# Patient Record
Sex: Male | Born: 1985 | Race: White | Hispanic: No | Marital: Single | Smoking: Never smoker
Health system: Southern US, Community
[De-identification: ages and names within clinical notes are randomized; demographics above are authoritative.]

---

## 2022-03-03 ENCOUNTER — Emergency Department (HOSPITAL_COMMUNITY): Payer: 59

## 2022-03-03 ENCOUNTER — Emergency Department (HOSPITAL_COMMUNITY)
Admission: EM | Admit: 2022-03-03 | Discharge: 2022-03-03 | Disposition: A | Discharge status: Home / Self Care | Payer: 59 | Attending: Emergency Medicine | Admitting: Emergency Medicine

## 2022-03-03 ENCOUNTER — Other Ambulatory Visit: Payer: Self-pay

## 2022-03-03 ENCOUNTER — Encounter (HOSPITAL_COMMUNITY): Payer: Self-pay | Admitting: Emergency Medicine

## 2022-03-03 DIAGNOSIS — H538 Other visual disturbances: Secondary | ICD-10-CM | POA: Diagnosis not present

## 2022-03-03 DIAGNOSIS — R7989 Other specified abnormal findings of blood chemistry: Secondary | ICD-10-CM | POA: Diagnosis not present

## 2022-03-03 DIAGNOSIS — R5383 Other fatigue: Secondary | ICD-10-CM | POA: Diagnosis not present

## 2022-03-03 DIAGNOSIS — R55 Syncope and collapse: Secondary | ICD-10-CM | POA: Insufficient documentation

## 2022-03-03 DIAGNOSIS — R519 Headache, unspecified: Secondary | ICD-10-CM

## 2022-03-03 LAB — CSF CELL COUNT WITH DIFFERENTIAL
RBC Count, CSF: 1 /mm3 — ABNORMAL HIGH
RBC Count, CSF: 17 /mm3 — ABNORMAL HIGH
Tube #: 1
Tube #: 4
WBC, CSF: 1 /mm3 (ref 0–5)
WBC, CSF: 1 /mm3 (ref 0–5)

## 2022-03-03 LAB — CBG MONITORING, ED: Glucose-Capillary: 79 mg/dL (ref 70–99)

## 2022-03-03 LAB — URINALYSIS, COMPLETE (UACMP) WITH MICROSCOPIC
Bacteria, UA: NONE SEEN
Bilirubin Urine: NEGATIVE
Glucose, UA: NEGATIVE mg/dL
Hgb urine dipstick: NEGATIVE
Ketones, ur: NEGATIVE mg/dL
Leukocytes,Ua: NEGATIVE
Nitrite: NEGATIVE
Protein, ur: NEGATIVE mg/dL
Specific Gravity, Urine: 1.004 — ABNORMAL LOW (ref 1.005–1.030)
pH: 6 (ref 5.0–8.0)

## 2022-03-03 LAB — CBC WITH DIFFERENTIAL/PLATELET
Abs Immature Granulocytes: 0.01 10*3/uL (ref 0.00–0.07)
Basophils Absolute: 0 10*3/uL (ref 0.0–0.1)
Basophils Relative: 0 %
Eosinophils Absolute: 0.1 10*3/uL (ref 0.0–0.5)
Eosinophils Relative: 3 %
HCT: 40.3 % (ref 39.0–52.0)
Hemoglobin: 13.9 g/dL (ref 13.0–17.0)
Immature Granulocytes: 0 %
Lymphocytes Relative: 26 %
Lymphs Abs: 1.2 10*3/uL (ref 0.7–4.0)
MCH: 30.5 pg (ref 26.0–34.0)
MCHC: 34.5 g/dL (ref 30.0–36.0)
MCV: 88.4 fL (ref 80.0–100.0)
Monocytes Absolute: 0.4 10*3/uL (ref 0.1–1.0)
Monocytes Relative: 9 %
Neutro Abs: 2.8 10*3/uL (ref 1.7–7.7)
Neutrophils Relative %: 62 %
Platelets: 301 10*3/uL (ref 150–400)
RBC: 4.56 MIL/uL (ref 4.22–5.81)
RDW: 12.6 % (ref 11.5–15.5)
WBC: 4.6 10*3/uL (ref 4.0–10.5)
nRBC: 0 % (ref 0.0–0.2)

## 2022-03-03 LAB — COMPREHENSIVE METABOLIC PANEL
ALT: 17 U/L (ref 0–44)
AST: 26 U/L (ref 15–41)
Albumin: 4.3 g/dL (ref 3.5–5.0)
Alkaline Phosphatase: 63 U/L (ref 38–126)
Anion gap: 5 (ref 5–15)
BUN: 12 mg/dL (ref 6–20)
CO2: 30 mmol/L (ref 22–32)
Calcium: 9.3 mg/dL (ref 8.9–10.3)
Chloride: 102 mmol/L (ref 98–111)
Creatinine, Ser: 0.64 mg/dL (ref 0.61–1.24)
GFR, Estimated: >60 mL/min (ref >60)
Glucose, Bld: 93 mg/dL (ref 70–99)
Potassium: 4.8 mmol/L (ref 3.5–5.1)
Sodium: 137 mmol/L (ref 135–145)
Total Bilirubin: 1.5 mg/dL — ABNORMAL HIGH (ref 0.3–1.2)
Total Protein: 7.8 g/dL (ref 6.5–8.1)

## 2022-03-03 LAB — TROPONIN I (HIGH SENSITIVITY): Troponin I (High Sensitivity): 4 ng/L (ref <18)

## 2022-03-03 LAB — PROTEIN, CSF: Total  Protein, CSF: 47 mg/dL — ABNORMAL HIGH (ref 15–45)

## 2022-03-03 LAB — GLUCOSE, CSF: Glucose, CSF: 58 mg/dL (ref 40–70)

## 2022-03-03 IMAGING — MR MR HEAD WO/W CM
13 series · 48 of 48 positions shown · IV contrast (gadavist)
Comparison: None.

CLINICAL DATA: Headache, new or worsening, neuro deficit (Age
18-49y) right unilateral headache, left blurry vision

EXAM:
MRI HEAD WITHOUT AND WITH CONTRAST
TECHNIQUE: Multiplanar, multiecho pulse sequences of the brain and surrounding
structures were obtained without and with intravenous contrast.
CONTRAST:  6mL GADAVIST GADOBUTROL 1 MMOL/ML IV SOLN

[Series 5: DWI · axial · 3.0mm · 1.36mm/px · z∈[-52,+97]mm · 6 of 104 slices shown (1 of 2)]
[im 1/104]
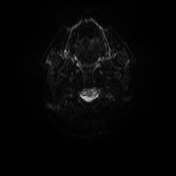
[im 21/104]
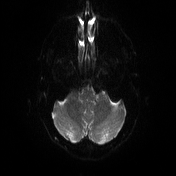
[im 42/104]
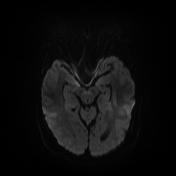
[im 62/104]
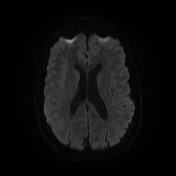
[im 83/104]
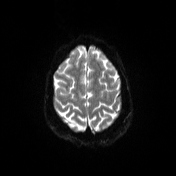
[im 104/104]
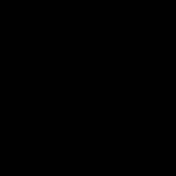

[Series 6: DWI · axial · 3.0mm · 1.36mm/px · z∈[-52,+91]mm · 3 of 50 slices shown (2 of 2)]
[im 1/50]
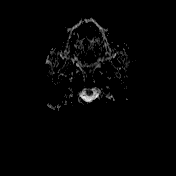
[im 25/50]
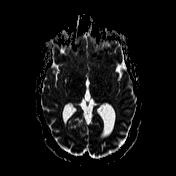
[im 50/50]
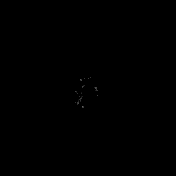

[Series 7: T1 · sagittal · 5.0mm · 0.75mm/px · 1 of 24 slices shown (1 of 2)]
[im 1/24]
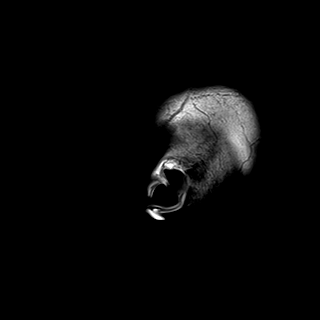

[Series 8: T2 · axial · 5.0mm · 0.62mm/px · z∈[-58,+100]mm · 2 of 26 slices shown]
[im 1/26]
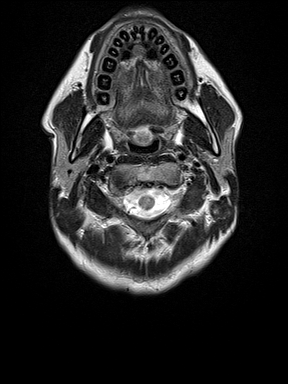
[im 26/26]
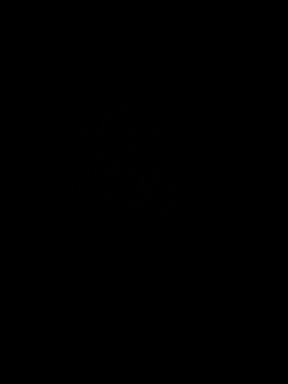

[Series 9: swi_images · axial · 3.0mm · 0.75mm/px · z∈[-59,+101]mm · 3 of 56 slices shown]
[im 1/56]
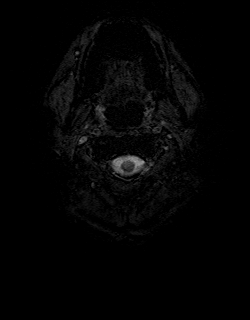
[im 28/56]
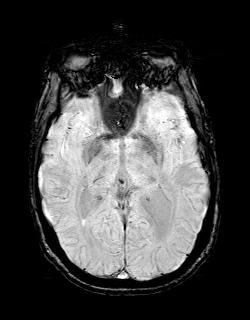
[im 56/56]
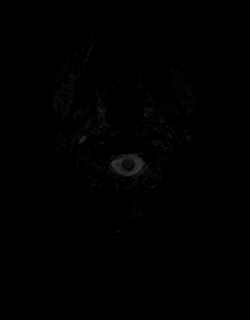

[Series 11: FLAIR · axial · 3.0mm · 0.75mm/px · z∈[-57,+98]mm · 3 of 54 slices shown]
[im 1/54]
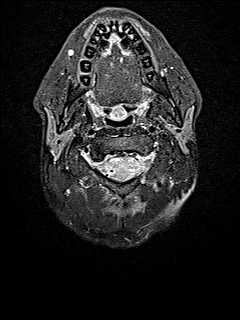
[im 27/54]
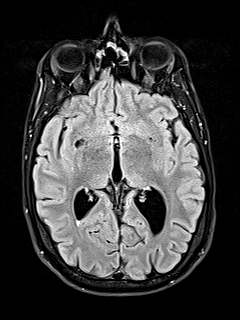
[im 54/54]
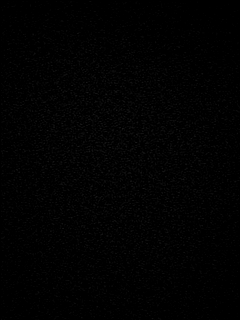

[Series 12: T1 · axial · 1.0mm · 0.94mm/px · z∈[-53,+101]mm · 10 of 160 slices shown (2 of 2)]
[im 1/160]
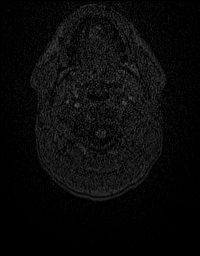
[im 18/160]
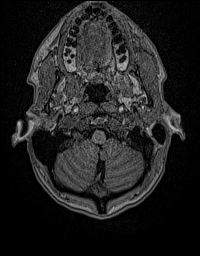
[im 36/160]
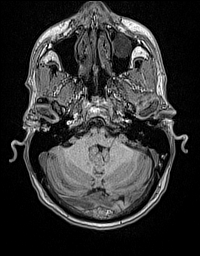
[im 54/160]
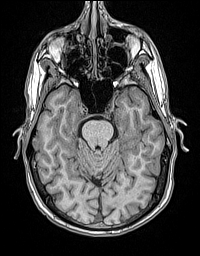
[im 71/160]
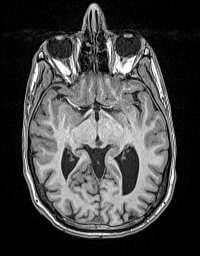
[im 89/160]
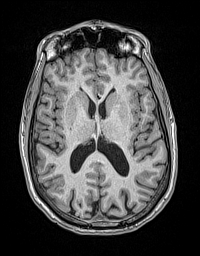
[im 107/160]
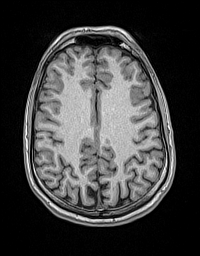
[im 124/160]
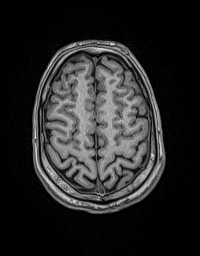
[im 142/160]
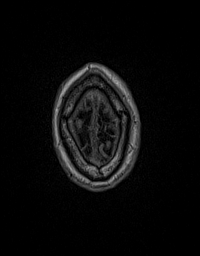
[im 160/160]
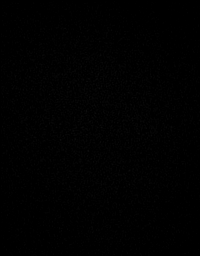

[Series 13: cor dwi_tracew · coronal · 5.0mm · 1.53mm/px · 3 of 56 slices shown]
[im 1/56]
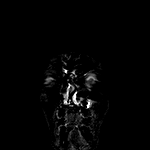
[im 28/56]
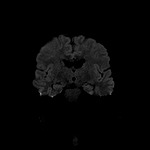
[im 56/56]
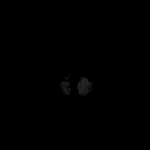

[Series 14: cor dwi_adc · coronal · 5.0mm · 1.53mm/px · 2 of 28 slices shown]
[im 1/28]
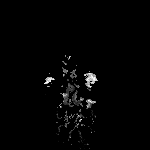
[im 28/28]
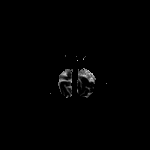

[Series 15: T2 post-contrast · coronal · 5.0mm · 0.57mm/px · 2 of 32 slices shown]
[im 1/32]
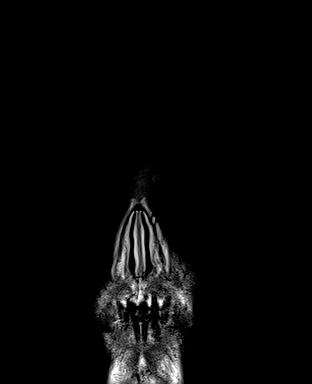
[im 32/32]
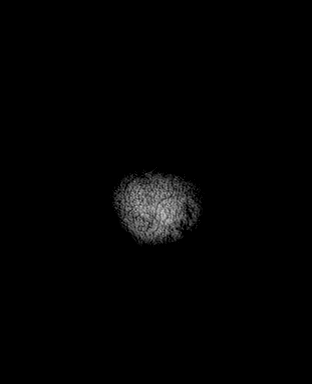

[Series 16: T1 post-contrast · axial · 1.0mm · 0.94mm/px · z∈[-53,+101]mm · 10 of 160 slices shown (1 of 3)]
[im 1/160]
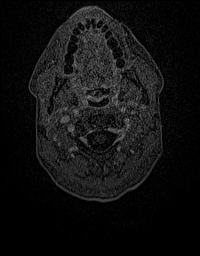
[im 18/160]
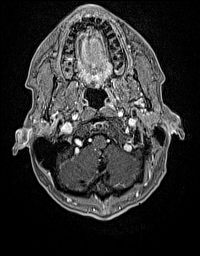
[im 36/160]
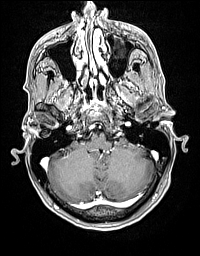
[im 54/160]
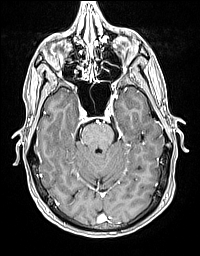
[im 71/160]
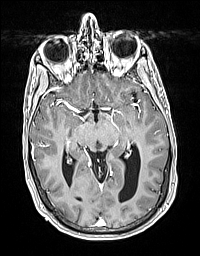
[im 89/160]
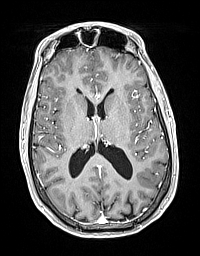
[im 107/160]
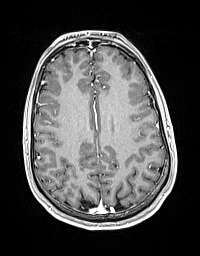
[im 124/160]
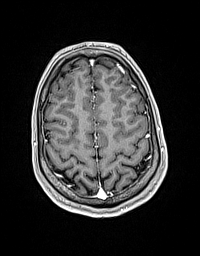
[im 142/160]
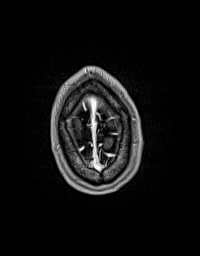
[im 160/160]
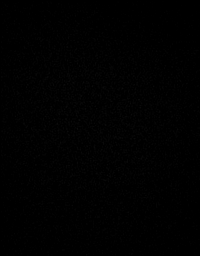

[Series 17: T1 post-contrast · coronal · 5.0mm · 0.43mm/px · 2 of 32 slices shown (2 of 3)]
[im 1/32]
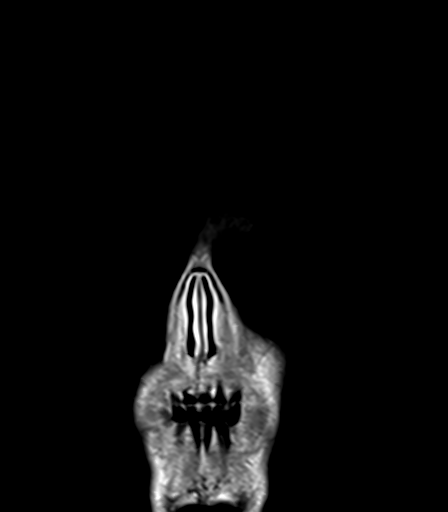
[im 32/32]
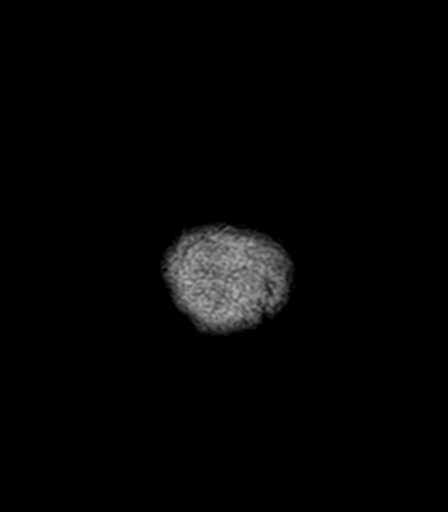

[Series 18: T1 post-contrast · sagittal · 5.0mm · 0.75mm/px · 1 of 24 slices shown (3 of 3)]
[im 1/24]
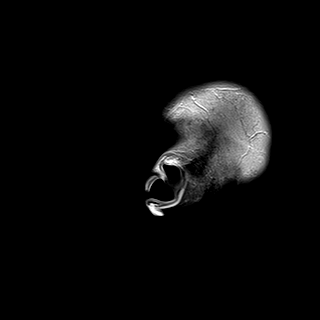

[48 of 48 positions shown; findings below may reference images not displayed]

FINDINGS: Brain: No acute infarction, hemorrhage, hydrocephalus, extra-axial
collection. Small (6 mm) focus of nonenhancing FLAIR hyperintensity
along the superior aspect of the cerebral aqueduct along the left
paramidline midbrain tectum (series 11, image 25). Benign dilated
perivascular spaces in bilateral inferior basal ganglia. No abnormal
enhancement.

Vascular: Major arterial flow voids are maintained at the skull
base.

Skull and upper cervical spine: Normal marrow signal.

Sinuses/Orbits: Mild paranasal sinus mucosal thickening. Retention
cyst in the left maxillary sinus. Unremarkable orbits.

Other: No sizable mastoid effusions.
IMPRESSION: 1. Small (6 mm) focus of nonenhancing FLAIR hyperintensity along the
superior aspect of the cerebral aqueduct along the left paramidline
midbrain tectum (series 11, image 25). This finding is indeterminate
and could represent infectious or inflammatory process versus small
neoplasm (including low-grade glioma and subependymoma). Consider
correlation with lumbar puncture. Also given the location and
potential for developing obstructive hydrocephalus, recommend short
interval follow-up MRI with contrast in approximately 1 month.
2. Otherwise, no evidence of acute intracranial abnormality. No
evidence of hydrocephalus.

Findings and recommendations discussed with provider Dr. PAULUS N via
telephone at [DATE].

## 2022-03-03 IMAGING — CR DG CHEST 2V
2 series · 2 of 2 positions shown · non-contrast
Comparison: None.

CLINICAL DATA: Shortness of breath, chest pain

EXAM:
CHEST - 2 VIEW

[w chest pa]
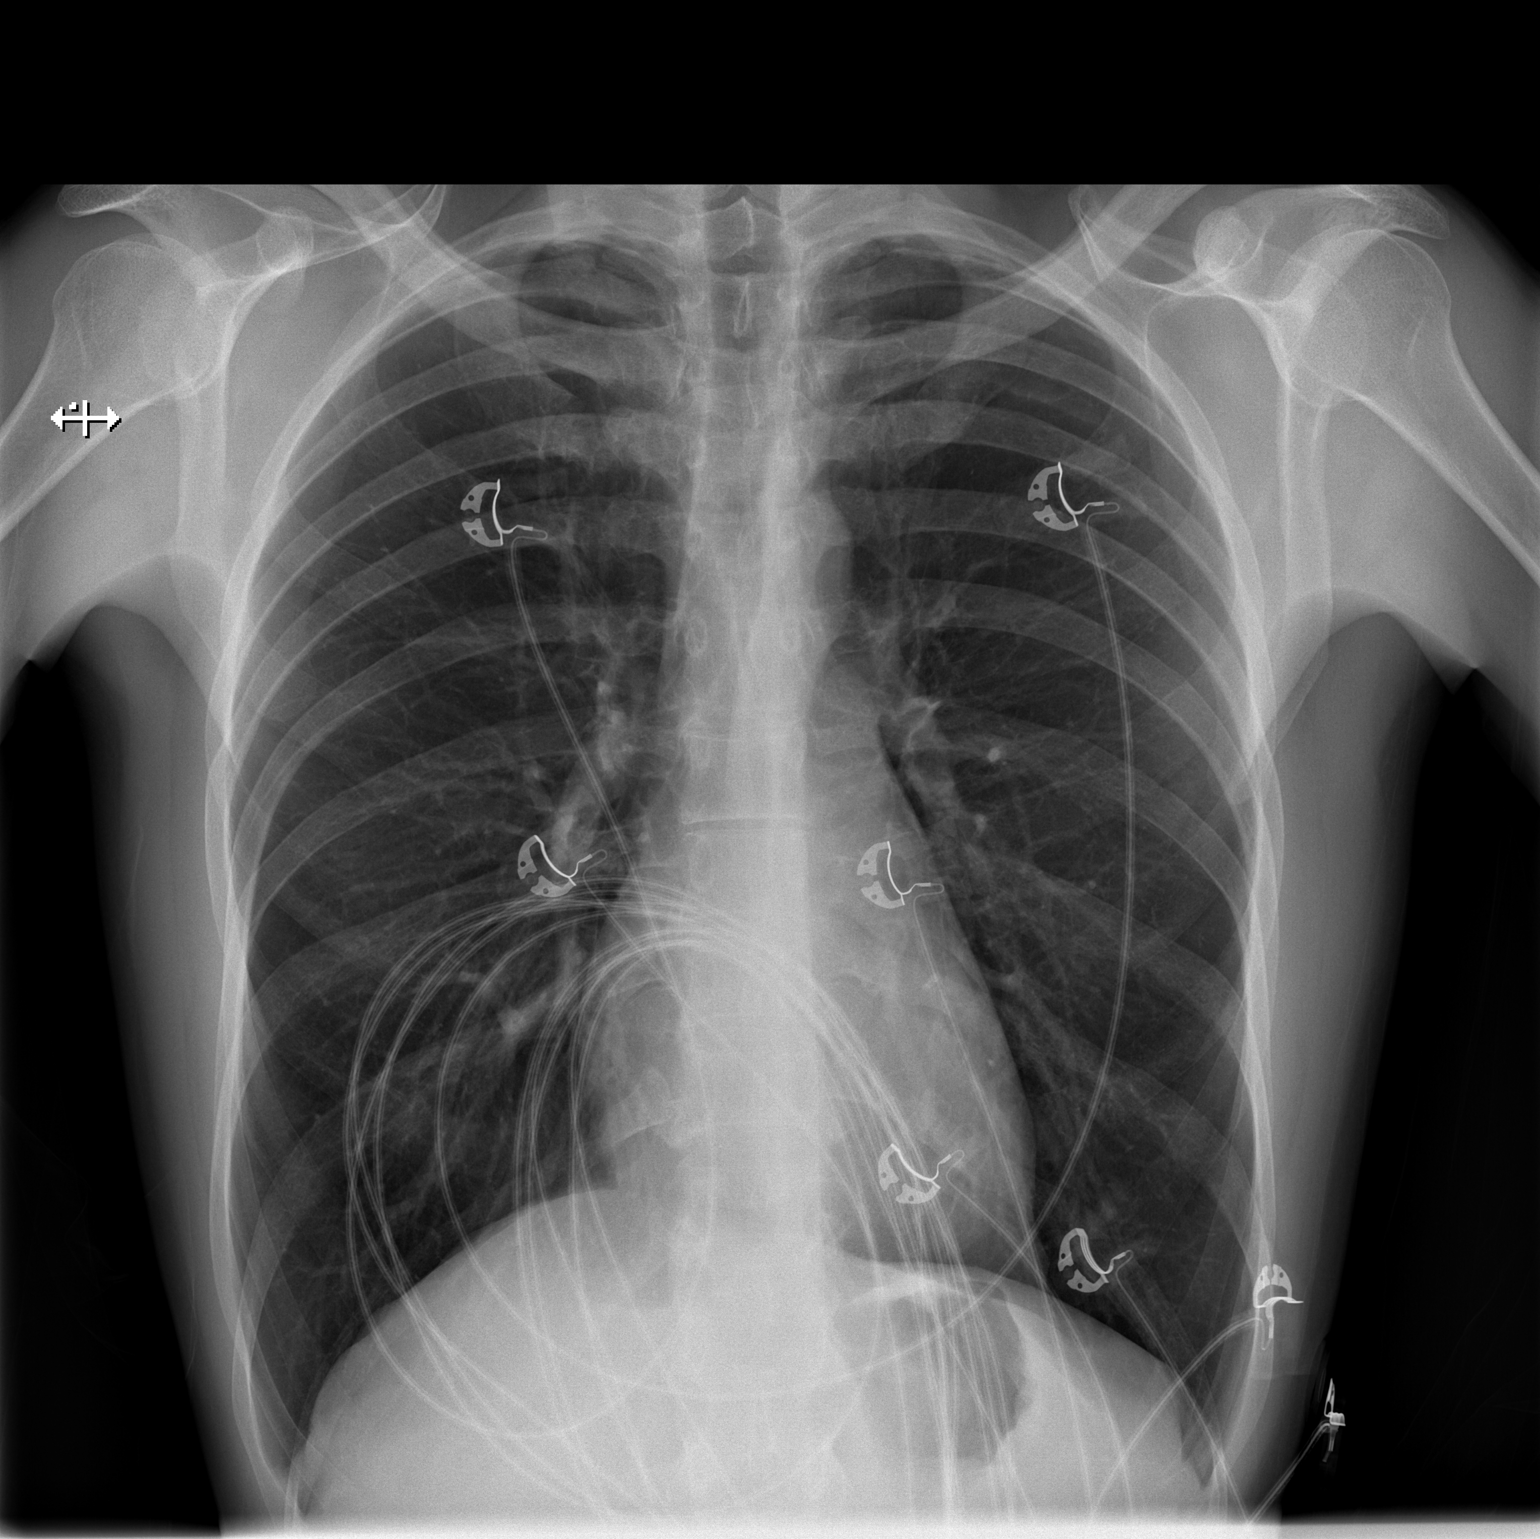

[w chest lat]
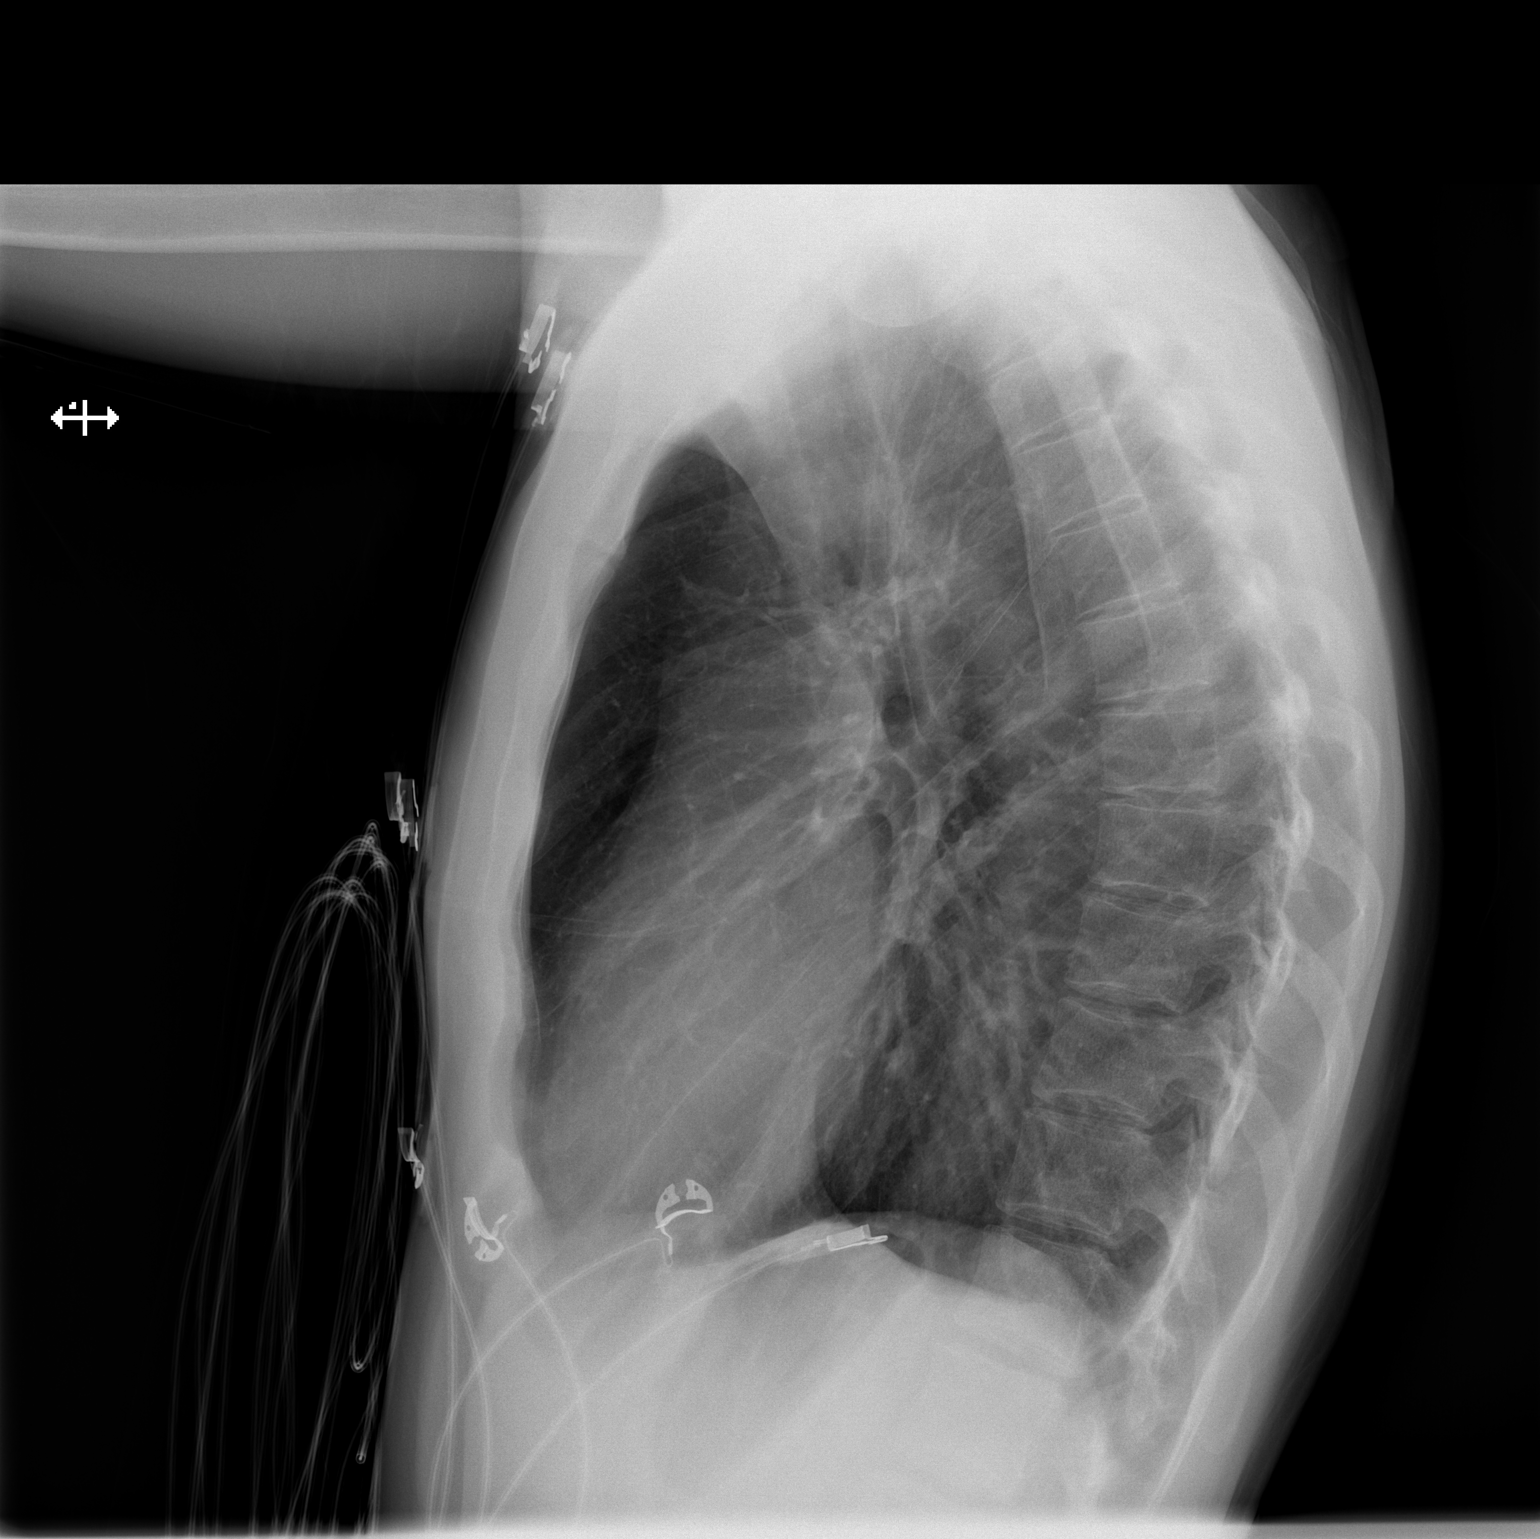

[2 of 2 positions shown; findings below may reference images not displayed]

FINDINGS: Cardiac and mediastinal contours are within normal limits. No focal
pulmonary opacity. No pleural effusion or pneumothorax. No acute
osseous abnormality.
IMPRESSION: No acute cardiopulmonary process.

## 2022-03-03 MED ORDER — LIDOCAINE-EPINEPHRINE 2 %-1:100000 IJ SOLN
20.0000 mL | Freq: Once | INTRAMUSCULAR | Status: AC
  Administered 2022-03-03: 20 mL via INTRADERMAL
  Filled 2022-03-03: qty 1

## 2022-03-03 MED ORDER — GADOBUTROL 1 MMOL/ML IV SOLN
6.0000 mL | Freq: Once | INTRAVENOUS | Status: AC | PRN
  Administered 2022-03-03: 6 mL via INTRAVENOUS

## 2022-03-03 NOTE — Discharge Instructions (Addendum)
You were seen in the ER today for your headaches. Your blood work was reassuring, however your MRI (images of your brain) had some abnormalities.  You underwent a lumbar puncture to test the cerebrospinal fluid.  You should expect a phone call from the neurooncology office (Dr. Mickeal Skinner) on Monday.  Please see their contact information below.  If you have not heard from them by Monday afternoon please give them a call to schedule a follow-up appointment.  This is of the utmost importance. ? ?Return to the ER if he develops any other new severe symptoms.  ? ? ?

## 2022-03-03 NOTE — ED Notes (Signed)
Visual acuity: left eye 20/30, right eye 20/40 ?

## 2022-03-03 NOTE — ED Provider Notes (Signed)
[facility] DEPT Provider Note   CSN: [id number] Arrival date & time: 03/03/22  1214     History  Chief Complaint  Patient presents with   Fatigue   Headache   Dizziness    Chad Mccoy is a 36 y.o. male who presents with concern for 3 weeks of ongoing right-sided headaches with associated intermittent left-sided blurry vision, lightheadedness, fatigue, and episodes of near syncope.  Patient states that shortly prior to onset of his symptoms he reports taking his girlfriends birth control pills for a few weeks in an effort to prevent pregnancy because he "did not trust her to be taking them".  He also states that they did not participate in any sexual intercourse throughout the relationship.  He states he stopped taking the birth control pills due to side effect burden.  Symptoms included at that time headache, lightheadedness, and fatigue.  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  He denies any weight loss that was unintentional.  States he has been urinating more frequently than normal.  I personally reviewed the patient's medical records.  Does not carry medical diagnoses nor is he any medications every day.  HPI     Home Medications Prior to Admission medications   Medication Sig Start Date End Date Taking? Authorizing Provider  acetaminophen (TYLENOL) 500 MG tablet Take 1,000 mg by mouth every 6 (six) hours as needed for mild pain or headache.   Yes [provider]      Allergies    Patient has no known allergies.    Review of Systems   Review of Systems  Constitutional:  Positive for fatigue. Negative for activity change, appetite change, chills, diaphoresis and fever.  HENT:  Positive for congestion and rhinorrhea. Negative for ear discharge, ear pain, facial  swelling and hearing loss.   Eyes:  Positive for photophobia. Negative for visual disturbance.  Respiratory:  Positive for shortness of breath. Negative for chest tightness.        SOB with exertion which is new  Cardiovascular:  Positive for chest pain. Negative for palpitations and leg swelling.  Gastrointestinal:  Positive for nausea. Negative for abdominal pain, anal bleeding, blood in stool, constipation, diarrhea and vomiting.  Genitourinary:  Positive for frequency and urgency. Negative for decreased urine volume, dysuria, flank pain and hematuria.  Musculoskeletal: Negative.   Neurological:  Positive for numbness and headaches.   Physical Exam Updated Vital Signs BP 116/69    Pulse 62    Temp 97.8 F (36.6 C) (Axillary)    Resp 17    Ht '5\' 11"'$  (1.803 m)    Wt 58.1 kg    SpO2 100%    BMI 17.85 kg/m  Physical Exam Vitals and nursing note reviewed.  Constitutional:      Appearance: He is not ill-appearing or toxic-appearing.  HENT:     Head: Normocephalic and atraumatic.     Nose: Nose normal.     Mouth/Throat:     Mouth: Mucous membranes are moist.     Pharynx: Oropharynx is clear. Uvula midline. No oropharyngeal exudate or posterior oropharyngeal erythema.     Tonsils: No tonsillar exudate.  Eyes:     General: Lids are normal. Vision grossly intact.        Right eye: No discharge.  Left eye: No discharge.     Extraocular Movements: Extraocular movements intact.     Conjunctiva/sclera: Conjunctivae normal.     Pupils: Pupils are equal, round, and reactive to light.  Neck:     Trachea: Trachea and phonation normal.  Cardiovascular:     Rate and Rhythm: Normal rate and regular rhythm.     Pulses: Normal pulses.     Heart sounds: Normal heart sounds. No murmur heard. No systolic murmur is present.  Pulmonary:     Effort: Pulmonary effort is normal. No tachypnea, bradypnea, accessory muscle usage, prolonged expiration or respiratory distress.     Breath sounds:  Normal breath sounds. No wheezing or rales.  Chest:     Chest wall: No mass, lacerations, deformity, swelling, tenderness or crepitus.  Abdominal:     General: Bowel sounds are normal. There is no distension.     Palpations: Abdomen is soft.     Tenderness: There is no abdominal tenderness. There is no right CVA tenderness, left CVA tenderness or guarding.  Musculoskeletal:        General: No deformity.     Cervical back: Normal range of motion and neck supple.     Right lower leg: No edema.     Left lower leg: No edema.  Lymphadenopathy:     Cervical: No cervical adenopathy.  Skin:    General: Skin is warm and dry.     Capillary Refill: Capillary refill takes less than 2 seconds.  Neurological:     General: No focal deficit present.     Mental Status: He is alert. Mental status is at baseline.     GCS: GCS eye subscore is 4. GCS verbal subscore is 5. GCS motor subscore is 6.     Cranial Nerves: Cranial nerves 2-12 are intact.     Sensory: Sensation is intact.     Motor: Motor function is intact.     Coordination: Coordination is intact.     Gait: Gait is intact.  Psychiatric:        Mood and Affect: Mood normal.    ED Results / Procedures / Treatments   Labs (all labs ordered are listed, but only abnormal results are displayed) Labs Reviewed  COMPREHENSIVE METABOLIC PANEL - Abnormal; Notable for the following components:      Result Value   Total Bilirubin 1.5 (*)    All other components within normal limits  URINALYSIS, COMPLETE (UACMP) WITH MICROSCOPIC - Abnormal; Notable for the following components:   Color, Urine STRAW (*)    Specific Gravity, Urine 1.004 (*)    All other components within normal limits  CSF CELL COUNT WITH DIFFERENTIAL - Abnormal; Notable for the following components:   RBC Count, CSF 17 (*)    All other components within normal limits  CSF CELL COUNT WITH DIFFERENTIAL - Abnormal; Notable for the following components:   RBC Count, CSF 1 (*)     All other components within normal limits  PROTEIN, CSF - Abnormal; Notable for the following components:   Total  Protein, CSF 47 (*)    All other components within normal limits  CSF CULTURE W GRAM STAIN  CBC WITH DIFFERENTIAL/PLATELET  GLUCOSE, CSF  MISC [facility] TEST (SEND OUT)  CBG MONITORING, ED  CYTOLOGY - NON PAP  TROPONIN I (HIGH SENSITIVITY)    EKG None  Radiology DG Chest 2 View  Result Date: 03/03/2022 CLINICAL DATA:  Shortness of breath, chest pain EXAM: CHEST - 2 VIEW COMPARISON:  None. FINDINGS: Cardiac and mediastinal contours are within normal limits. No focal pulmonary opacity. No pleural effusion or pneumothorax. No acute osseous abnormality. IMPRESSION: No acute cardiopulmonary process. Electronically Signed   By: Merilyn Baba M.D.   On: 03/03/2022 14:28   MR Brain W and Wo Contrast  Result Date: 03/03/2022 CLINICAL DATA:  Headache, new or worsening, neuro deficit (Age 87-49y) right unilateral headache, left blurry vision EXAM: MRI HEAD WITHOUT AND WITH CONTRAST TECHNIQUE: Multiplanar, multiecho pulse sequences of the brain and surrounding structures were obtained without and with intravenous contrast. CONTRAST:  72m GADAVIST GADOBUTROL 1 MMOL/ML IV SOLN COMPARISON:  None. FINDINGS: Brain: No acute infarction, hemorrhage, hydrocephalus, extra-axial collection. Small (6 mm) focus of nonenhancing FLAIR hyperintensity along the superior aspect of the cerebral aqueduct along the left paramidline midbrain tectum (series 11, image 25). Benign dilated perivascular spaces in bilateral inferior basal ganglia. No abnormal enhancement. Vascular: Major arterial flow voids are maintained at the skull base. Skull and upper cervical spine: Normal marrow signal. Sinuses/Orbits: Mild paranasal sinus mucosal thickening. Retention cyst in the left maxillary sinus. Unremarkable orbits. Other: No sizable mastoid effusions. IMPRESSION: 1. Small (6 mm) focus of nonenhancing FLAIR hyperintensity  along the superior aspect of the cerebral aqueduct along the left paramidline midbrain tectum (series 11, image 25). This finding is indeterminate and could represent infectious or inflammatory process versus small neoplasm (including low-grade glioma and subependymoma). Consider correlation with lumbar puncture. Also given the location and potential for developing obstructive hydrocephalus, recommend short interval follow-up MRI with contrast in approximately 1 month. 2. Otherwise, no evidence of acute intracranial abnormality. No evidence of hydrocephalus. Findings and recommendations discussed with provider Dr. TGustavus Messingvia telephone at 4:33 PM. Electronically Signed   By: FMargaretha SheffieldM.D.   On: 03/03/2022 16:39    Procedures .Critical Care Performed by: SEmeline Darling PA-C Authorized by: SEmeline Darling PA-C   Critical care provider statement:    Critical care time (minutes):  45   Critical care was time spent personally by me on the following activities:  Development of treatment plan with patient or surrogate, discussions with consultants, evaluation of patient's response to treatment, examination of patient, obtaining history from patient or surrogate, ordering and performing treatments and interventions, ordering and review of laboratory studies, ordering and review of radiographic studies, pulse oximetry and re-evaluation of patient's condition    Medications Ordered in ED Medications  gadobutrol (GADAVIST) 1 MMOL/ML injection 6 mL (6 mLs Intravenous Contrast Given 03/03/22 1612)  lidocaine-EPINEPHrine (XYLOCAINE W/EPI) 2 %-1:100000 (with pres) injection 20 mL (20 mLs Intradermal Given 03/03/22 1838)    ED Course/ Medical Decision Making/ A&P Clinical Course as of 03/03/22 2229  Fri Mar 03, 2022  1753 Case and MRI results discussed extensively with Neurologist Dr. ARory Percy  He recommends lumbar puncture with glucose/protein/cell count/cytology/flow cytometry of the CSF.   No indications for any CT imaging at this time. Dr. ARory Percydiscussed the case with the neurosurgeon Dr. OVenetia Constablewho agrees with plan for LP.  Additionally patient has been added to tumor board discussion list for Monday with neuro-oncology.  Lastly, patient should anticipate phone call from neuro-oncology clinic of Dr. VMickeal Skinnerearly next week to establish outpatient follow-up.  I appreciate their collaboration in the care of this patient. [RS]  [id number] discussion with the patient regarding the role of lumbar puncture in the ED at this time as well as imperative nature of outpatient follow-up with neuro oncologist.  He is agreeable to this  plan at this time.  RN to document informed consent of procedure [RS]    Clinical Course User Index [RS] Braylei Totino, Gypsy Balsam, PA-C                           Medical Decision Making 36 year old male who presents with concern for headaches for the last 3 weeks that are unilateral with associated contralateral blurry vision in context of recent OCP usage.  Differential diagnosis includes but is not limited to cerebral venous thrombosis, mass/neoplasm, meningitis, intracranial hemorrhage, hydrocephalus, CVA, tension type headaches.  Bradycardic intake to the 50s, vital signs otherwise normal.  Cardiopulmonary and abdominal exams are benign.  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  Amount and/or Complexity of Data Reviewed Labs: ordered.    Details: CBC is without leukocytosis or anemia.  CMP with very mildly elevated total bilirubin to 1.5.  Urine without signs of infection, troponin negative at 4. CSF studies very reassuring, without significant derangement and cell counts.  Glucose and protein reassuring.  Flow cytometry and cytology of CSF pending at this time. Radiology: ordered.    Details: Chest x-ray  negative for acute cardiopulmonary disease.   MRI of the brain with findings concerning for focus of nonenhancing hyperintensity along the superior cerebral aqueduct along the left paramidline midbrain tectum concerning for possible neoplasm versus infectious or inflammatory process. ECG/medicine tests: ordered and independent interpretation performed.    Details: EKG with normal sinus rhythm without ST changes. Discussion of management or test interpretation with external provider(s): Case discussed with Dr. Rory Percy as above.  Risk Prescription drug management.   Lumbar puncture performed by attending ED physician Dr. Jeanell Sparrow with successful collection of clear CSF.  Reassuring CSF evaluation.  Patient tolerating p.o. well and ambulatory in the emergency department without headache at this time.  No further work-up warranted in the ER.  Extensive discussion with the patient regarding disposition plan for discharge home with close outpatient follow-up with the neuro-oncology team and Dr. Mickeal Skinner.  Galan voiced understanding of his medical evaluation and treatment plan today.  Each of his questions was answered to his expressed satisfaction.  He is amenable to follow-up with the outpatient providers as discussed.  Patient is well-appearing, stable, and was discharged in good condition.  This chart was dictated using voice recognition software, Dragon. Despite the best efforts of this provider to proofread and correct errors, errors may still occur which can change documentation meaning.  Final Clinical Impression(s) / ED Diagnoses Final diagnoses:  Nonintractable headache, unspecified chronicity pattern, unspecified headache type    Rx / DC Orders ED Discharge Orders     None         Aura Dials 03/03/22 2230    Tegeler, Gwenyth Allegra, MD 03/06/22 1623

## 2022-03-03 NOTE — ED Triage Notes (Signed)
Patient complains of fatigue, lightheadedness and headaches for a few weeks. He believes it has worsened over time and has led to some confusion at work.  ?

## 2022-03-03 NOTE — ED Provider Notes (Signed)
Patient seen with Rod Can, PA-C.  Patient has been having some headaches.  She had seen and evaluated patient with Dr. Gustavus Messing originally.  Consult was obtained from neurology and MRI obtained further recommendation. ?Abnormality noted on MRI.  Concern for mass specifically.  Neurology and neuro oncology advise LP and follow-up as outpatient. ?.Lumbar Puncture ? ?Date/Time: 03/03/2022 9:25 PM ?Performed by: Pattricia Boss, MD ?Authorized by: Pattricia Boss, MD  ? ?Consent:  ?  Consent obtained:  Written ?  Consent given by:  Patient ?  Risks, benefits, and alternatives were discussed: yes   ?  Risks discussed:  Bleeding, infection and pain ?  Alternatives discussed:  Delayed treatment ?Universal protocol:  ?  Patient identity confirmed:  Verbally with patient and arm band ?Pre-procedure details:  ?  Procedure purpose:  Diagnostic ?  Preparation: Patient was prepped and draped in usual sterile fashion   ?Anesthesia:  ?  Anesthesia method:  Local infiltration ?  Local anesthetic:  Lidocaine 1% WITH epi ?Procedure details:  ?  Lumbar space:  L3-L4 interspace ?  Patient position:  L lateral decubitus ?  Needle gauge:  22 ?  Needle type:  Marcille Blanco point ?  Needle length (in):  2.5 ?  Ultrasound guidance: no   ?  Number of attempts:  3 ?  Fluid appearance:  Clear ?  Tubes of fluid:  4 ?Post-procedure details:  ?  Puncture site:  Adhesive bandage applied ?  Procedure completion:  Tolerated ? ?  ?Pattricia Boss, MD ?03/03/22 2126 ? ?

## 2022-03-07 ENCOUNTER — Telehealth: Payer: Self-pay | Admitting: Internal Medicine

## 2022-03-07 LAB — CYTOLOGY - NON PAP

## 2022-03-07 LAB — CSF CULTURE W GRAM STAIN
Culture: NO GROWTH
Gram Stain: NONE SEEN

## 2022-03-07 NOTE — Telephone Encounter (Signed)
Scheduled appt per 3/13 staff msg from [facility]. Pt is aware of appt date and time. Pt is aware to arrive 15 mins prior to appt time and to bring and updated insurance card. Pt is aware of appt location.   ?

## 2022-03-14 ENCOUNTER — Other Ambulatory Visit: Payer: Self-pay

## 2022-03-14 ENCOUNTER — Inpatient Hospital Stay: Payer: 59 | Attending: Internal Medicine | Admitting: Internal Medicine

## 2022-03-14 DIAGNOSIS — G9389 Other specified disorders of brain: Secondary | ICD-10-CM

## 2022-03-14 DIAGNOSIS — G939 Disorder of brain, unspecified: Secondary | ICD-10-CM | POA: Diagnosis present

## 2022-03-14 DIAGNOSIS — C717 Malignant neoplasm of brain stem: Secondary | ICD-10-CM | POA: Insufficient documentation

## 2022-03-14 MED ORDER — DEXAMETHASONE 4 MG PO TABS: 4.0000 mg | ORAL_TABLET | Freq: Every day | ORAL | 0 refills | Status: DC

## 2022-03-14 NOTE — Progress Notes (Signed)
? ?[facility] [address]  ?[place], [place] [address] ?[phone]) [phone] ? ? ?New Patient Evaluation ? ?Date of Service: 03/14/22 ?Patient Name: Chad Mccoy ?Patient MRN: [id number] ?Patient DOB: Apr 06, 1986 ?Provider: Ventura Sellers, MD ? ?Identifying Statement:  ?Chad Mccoy is a 36 y.o. male with posterior fossa  mass lesion  who presents for initial consultation and evaluation.   ? ?Referring Provider: ?No referring provider defined for this encounter. ? ?Oncologic History: ?03/03/22: Presents to ED with headache, CT/MRI demonstrates non-enhancing midbrain mass lesion ? ?Biomarkers: ? ?MGMT Unknown.  ?IDH 1/2 Unknown.  ?EGFR Unknown  ?TERT Unknown  ? ?History of Present Illness: ?The patient's records from the referring physician were obtained and reviewed and the patient interviewed to confirm this HPI.  Chad Mccoy presented to medical attention this month with several weeks of headaches and dizzy feeling.  Headaches are described as holocranial, mainly without migrainous features, most often in the morning.  Occasionally will wake up in the morning with nausea as well.  Dizziness is described as "off balance feeling, most often when going up or down stairs".  Denies frank vertigo.  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  Otherwise functionally intact, cares for his own needs.  No other neurologic complaints, denies double vision, seizures. ? ?Medications: ?Current Outpatient Medications on File Prior to Visit  ?Medication Sig Dispense Refill  ? acetaminophen (TYLENOL) 500 MG tablet Take 1,000 mg by mouth every 6 (six) hours as needed for mild pain or headache.    ? ?No current facility-administered medications on file prior to visit.  ? ? ?Allergies: No Known Allergies ?Past Medical History: No past medical history on file. ?Past Surgical History: No past surgical history on file. ?Social History:  ?Social History   ? ?Socioeconomic History  ? Marital status: Single  ?  Spouse name: Not on file  ? Number of children: Not on file  ? Years of education: Not on file  ? Highest education level: Not on file  ?Occupational History  ? Not on file  ?Tobacco Use  ? Smoking status: Never  ? Smokeless tobacco: Never  ?Substance and Sexual Activity  ? Alcohol use: Not on file  ? Drug use: Not on file  ? Sexual activity: Not on file  ?Other Topics Concern  ? Not on file  ?Social History Narrative  ? Not on file  ? ?Social Determinants of Health  ? ?Financial Resource Strain: Not on file  ?Food Insecurity: Not on file  ?Transportation Needs: Not on file  ?Physical Activity: Not on file  ?Stress: Not on file  ?Social Connections: Not on file  ?Intimate Partner Violence: Not on file  ? ?Family History: No family history on file. ? ?Review of Systems: ?Constitutional: Doesn't report fevers, chills or abnormal weight loss ?Eyes: Doesn't report blurriness of vision ?Ears, nose, mouth, throat, and face: Doesn't report sore throat ?Respiratory: Doesn't report cough, dyspnea or wheezes ?Cardiovascular: Doesn't report palpitation, chest discomfort  ?Gastrointestinal:  Doesn't report nausea, constipation, diarrhea ?GU: Doesn't report incontinence ?Skin: Doesn't report skin rashes ?Neurological: Per HPI ?Musculoskeletal: Doesn't report joint pain ?Behavioral/Psych: Doesn't report anxiety ? ?Physical Exam: ?Vitals:  ? 03/14/22 0924  ?BP: 117/71  ?Pulse: (!) 58  ?Resp: 18  ?Temp: (!) 97.2 ?F (36.2 ?C)  ?SpO2: 100%  ? ?KPS: 100. ?General: Alert, cooperative, pleasant, in no acute distress ?Head: Normal ?EENT: No conjunctival injection or scleral icterus.  ?Lungs:  Resp effort normal ?Cardiac: Regular rate ?Abdomen: Non-distended abdomen ?Skin: No rashes cyanosis or petechiae. ?Extremities: No clubbing or edema ? ?Neurologic Exam: ?Mental Status: Awake, alert, attentive to examiner. Oriented to self and environment. Language is fluent with intact  comprehension.  ?Cranial Nerves: Visual acuity is grossly normal. Visual fields are full. Extra-ocular movements intact. No ptosis. Face is symmetric ?Motor: Tone and bulk are normal. Power is full in both arms and legs. Reflexes are symmetric, no pathologic reflexes present.  ?Sensory: Intact to light touch ?Gait: Normal. ? ? ?Labs: ?I have reviewed the data as listed ?   ?Component Value Date/Time  ? NA 137 03/03/2022 1525  ? K 4.8 03/03/2022 1525  ? CL 102 03/03/2022 1525  ? CO2 30 03/03/2022 1525  ? GLUCOSE 93 03/03/2022 1525  ? BUN 12 03/03/2022 1525  ? CREATININE 0.64 03/03/2022 1525  ? CALCIUM 9.3 03/03/2022 1525  ? PROT 7.8 03/03/2022 1525  ? ALBUMIN 4.3 03/03/2022 1525  ? AST 26 03/03/2022 1525  ? ALT 17 03/03/2022 1525  ? ALKPHOS 63 03/03/2022 1525  ? BILITOT 1.5 (H) 03/03/2022 1525  ? GFRNONAA >60 03/03/2022 1525  ? ?Lab Results  ?Component Value Date  ? WBC 4.6 03/03/2022  ? NEUTROABS 2.8 03/03/2022  ? HGB 13.9 03/03/2022  ? HCT 40.3 03/03/2022  ? MCV 88.4 03/03/2022  ? PLT 301 03/03/2022  ? ? ?Imaging: ? ?DG Chest 2 View ? ?Result Date: 03/03/2022 ?CLINICAL DATA:  Shortness of breath, chest pain EXAM: CHEST - 2 VIEW COMPARISON:  None. FINDINGS: Cardiac and mediastinal contours are within normal limits. No focal pulmonary opacity. No pleural effusion or pneumothorax. No acute osseous abnormality. IMPRESSION: No acute cardiopulmonary process. Electronically Signed   By: Merilyn Baba M.D.   On: 03/03/2022 14:28  ? ?MR Brain W and Wo Contrast ? ?Result Date: 03/03/2022 ?CLINICAL DATA:  Headache, new or worsening, neuro deficit (Age 38-49y) right unilateral headache, left blurry vision EXAM: MRI HEAD WITHOUT AND WITH CONTRAST TECHNIQUE: Multiplanar, multiecho pulse sequences of the brain and surrounding structures were obtained without and with intravenous contrast. CONTRAST:  57m GADAVIST GADOBUTROL 1 MMOL/ML IV SOLN COMPARISON:  None. FINDINGS: Brain: No acute infarction, hemorrhage, hydrocephalus,  extra-axial collection. Small (6 mm) focus of nonenhancing FLAIR hyperintensity along the superior aspect of the cerebral aqueduct along the left paramidline midbrain tectum (series 11, image 25). Benign dilated perivascular spaces in bilateral inferior basal ganglia. No abnormal enhancement. Vascular: Major arterial flow voids are maintained at the skull base. Skull and upper cervical spine: Normal marrow signal. Sinuses/Orbits: Mild paranasal sinus mucosal thickening. Retention cyst in the left maxillary sinus. Unremarkable orbits. Other: No sizable mastoid effusions. IMPRESSION: 1. Small (6 mm) focus of nonenhancing FLAIR hyperintensity along the superior aspect of the cerebral aqueduct along the left paramidline midbrain tectum (series 11, image 25). This finding is indeterminate and could represent infectious or inflammatory process versus small neoplasm (including low-grade glioma and subependymoma). Consider correlation with lumbar puncture. Also given the location and potential for developing obstructive hydrocephalus, recommend short interval follow-up MRI with contrast in approximately 1 month. 2. Otherwise, no evidence of acute intracranial abnormality. No evidence of hydrocephalus. Findings and recommendations discussed with provider Dr. TGustavus Messingvia telephone at 4:33 PM. Electronically Signed   By: FMargaretha SheffieldM.D.   On: 03/03/2022 16:39   ? ?Pathology: n/a ? ?Assessment/Plan ?Brain mass ? 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  Etiology is suspected neoplasm vs inflammation.  If neoplasm, would favor low grade lesion such as tectal plate glioma, subependymoma.  That said, early presentation of more aggressive process is still within differential.  ? ?At this time, we would recommend repeating a contrast enhanced brain MRI in 1 month for further  characterization of the lesion, change over time.   ? ?For symptoms and help with diagnosis, recommended trial of dexamethasone 79m daily. ? ?Screening for potential clinical trials was performed and discussed using eligibility criter

## 2022-03-15 ENCOUNTER — Telehealth: Payer: Self-pay | Admitting: Internal Medicine

## 2022-03-15 NOTE — Telephone Encounter (Signed)
Scheduled per 3/21 los, pt has been called and confirmed  ?

## 2022-03-28 ENCOUNTER — Telehealth: Payer: Self-pay

## 2022-03-28 NOTE — Telephone Encounter (Signed)
Pt called regarding an MRI that is due before 04/07/2022 to be scheduled and done. Pt was given Centralized Scheduling number [phone]) so he can go ahead and get it scheduled. Pt verbalized understanding and knows he can call back with any questions or concerns.  ?

## 2022-03-30 ENCOUNTER — Other Ambulatory Visit: Payer: Self-pay | Admitting: Radiation Therapy

## 2022-04-07 ENCOUNTER — Ambulatory Visit (HOSPITAL_COMMUNITY): Payer: 59

## 2022-04-07 ENCOUNTER — Telehealth: Payer: Self-pay

## 2022-04-07 NOTE — Telephone Encounter (Signed)
Left VM advising rescheduled apt with Dr. Mickeal Skinner via phone is 04/17/22 @ 9:30 AM ?

## 2022-04-07 NOTE — Telephone Encounter (Signed)
Pt called to say MRI scheduled for today 04/07/22 was cancelled because it was not authorized. The MRI has been rescheduled  for 04/12/22 @ 7 PM. High Priority message sent to revenue cycle for prior authorization.  ?

## 2022-04-11 ENCOUNTER — Ambulatory Visit: Payer: 59 | Admitting: Internal Medicine

## 2022-04-12 ENCOUNTER — Ambulatory Visit (HOSPITAL_COMMUNITY)
Admission: RE | Admit: 2022-04-12 | Discharge: 2022-04-12 | Disposition: A | Discharge status: Home / Self Care | Payer: 59 | Source: Ambulatory Visit | Attending: Internal Medicine | Admitting: Internal Medicine

## 2022-04-12 DIAGNOSIS — G9389 Other specified disorders of brain: Secondary | ICD-10-CM | POA: Insufficient documentation

## 2022-04-12 IMAGING — MR MR HEAD WO/W CM
15 series · 48 of 48 positions shown · IV contrast (6ml GADAVIST)
Comparison: Brain MRI [DATE]

CLINICAL DATA: Brain/CNS neoplasm

EXAM:
MRI HEAD WITHOUT AND WITH CONTRAST
TECHNIQUE: Multiplanar, multiecho pulse sequences of the brain and surrounding
structures were obtained without and with intravenous contrast.
CONTRAST:  6mL GADAVIST GADOBUTROL 1 MMOL/ML IV SOLN

[Series 5: DWI · axial · 3.0mm · 1.36mm/px · z∈[-101,+39]mm · 6 of 96 slices shown (1 of 2)]
[im 1/96]
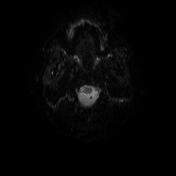
[im 20/96]
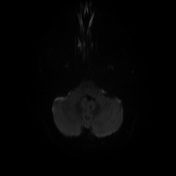
[im 39/96]
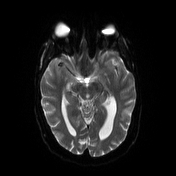
[im 58/96]
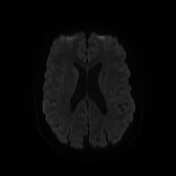
[im 77/96]
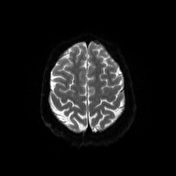
[im 96/96]
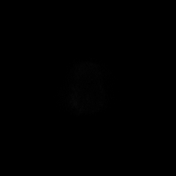

[Series 6: DWI · axial · 3.0mm · 1.36mm/px · z∈[-101,+39]mm · 3 of 46 slices shown (2 of 2)]
[im 1/46]
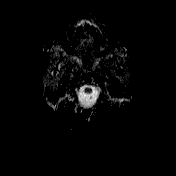
[im 23/46]
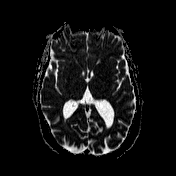
[im 46/46]
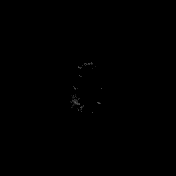

[Series 7: T1 · sagittal · 5.0mm · 0.75mm/px · 1 of 24 slices shown (1 of 4)]
[im 1/24]
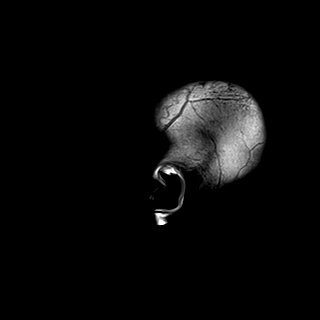

[Series 8: T2 · axial · 5.0mm · 0.62mm/px · 1 of 24 slices shown]
[im 1/24]
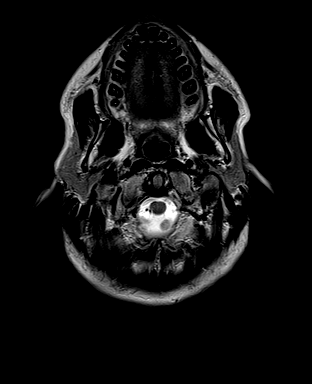

[Series 9: swi_images · axial · 3.0mm · 0.75mm/px · z∈[-113,+50]mm · 3 of 56 slices shown]
[im 1/56]
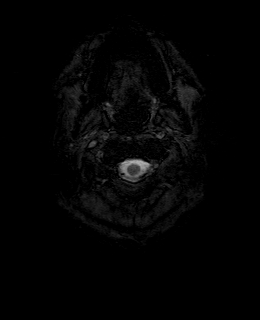
[im 28/56]
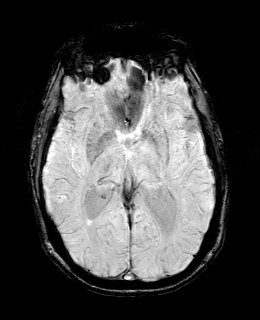
[im 56/56]
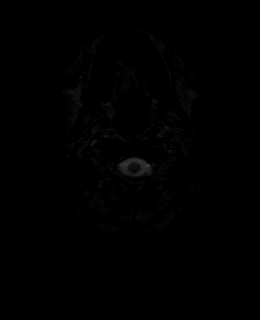

[Series 11: FLAIR · axial · 3.0mm · 0.75mm/px · z∈[-107,+44]mm · 3 of 52 slices shown]
[im 1/52]
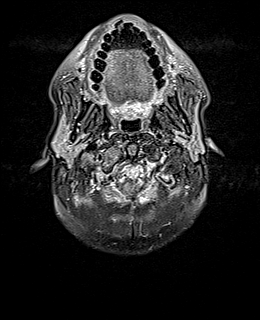
[im 26/52]
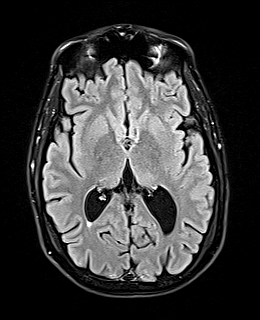
[im 52/52]
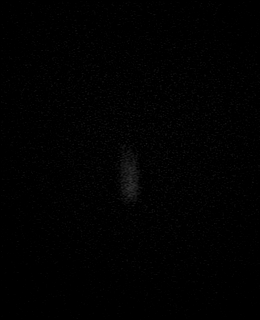

[Series 12: T1 · axial · 1.0mm · 0.94mm/px · z∈[-102,+39]mm · 8 of 144 slices shown (2 of 4)]
[im 1/144]
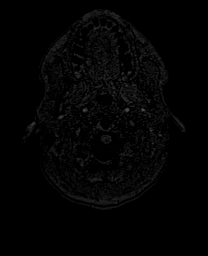
[im 21/144]
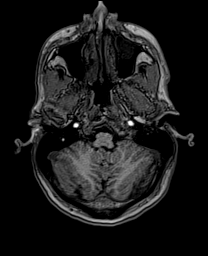
[im 41/144]
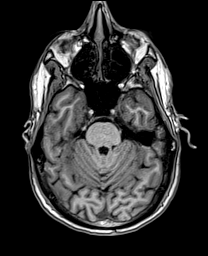
[im 62/144]
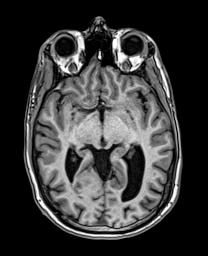
[im 82/144]
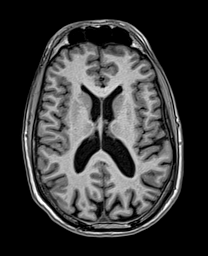
[im 103/144]
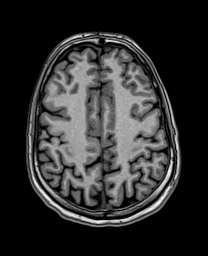
[im 123/144]
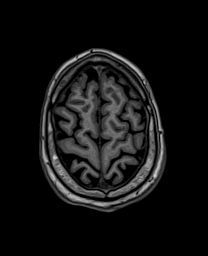
[im 144/144]
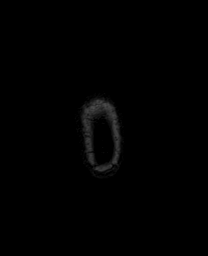

[Series 13: cor dwi_tracew · coronal · 5.0mm · 1.53mm/px · 3 of 56 slices shown]
[im 1/56]
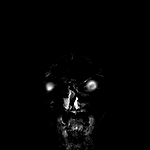
[im 28/56]
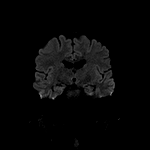
[im 56/56]
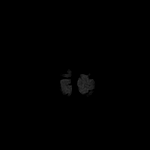

[Series 14: cor dwi_adc · coronal · 5.0mm · 1.53mm/px · 2 of 28 slices shown]
[im 1/28]
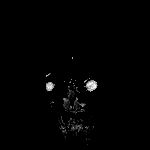
[im 28/28]
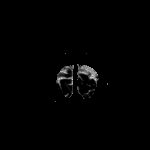

[Series 15: T2 post-contrast · coronal · 5.0mm · 0.57mm/px · 2 of 28 slices shown]
[im 1/28]
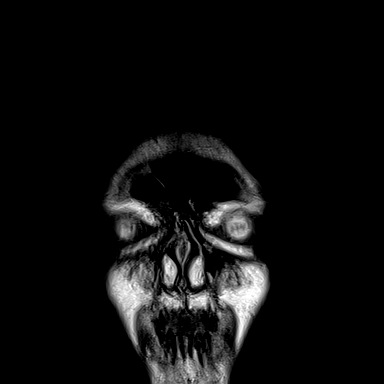
[im 28/28]
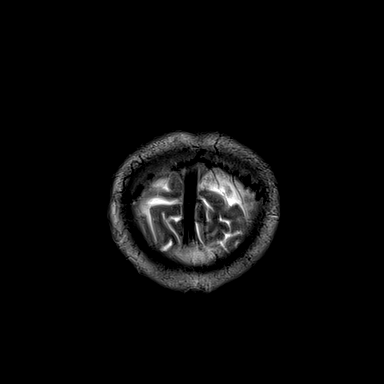

[Series 16: T1 post-contrast · axial · 1.0mm · 0.94mm/px · z∈[-102,+39]mm · 8 of 144 slices shown (1 of 3)]
[im 1/144]
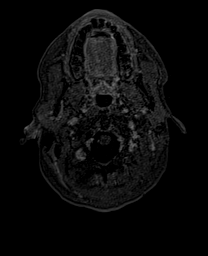
[im 21/144]
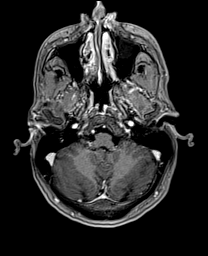
[im 41/144]
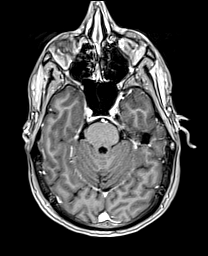
[im 62/144]
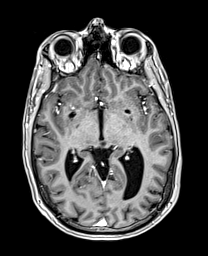
[im 82/144]
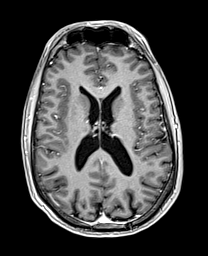
[im 103/144]
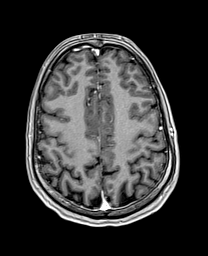
[im 123/144]
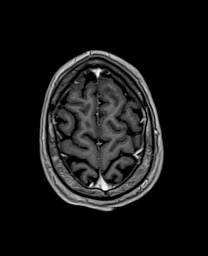
[im 144/144]
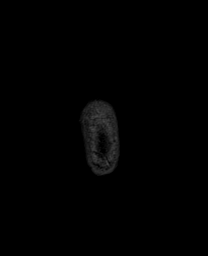

[Series 17: T1 · sagittal · 4.0mm · 0.94mm/px · 2 of 36 slices shown (3 of 4)]
[im 1/36]
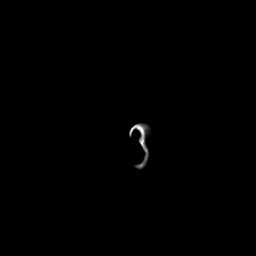
[im 36/36]
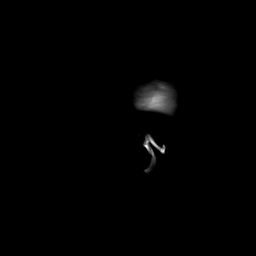

[Series 18: T1 · coronal · 4.0mm · 0.94mm/px · 3 of 44 slices shown (4 of 4)]
[im 1/44]
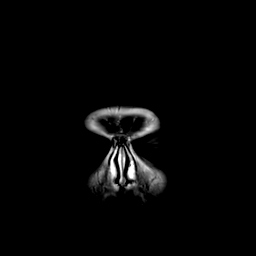
[im 22/44]
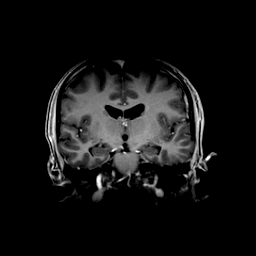
[im 44/44]
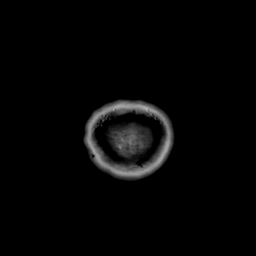

[Series 19: T1 post-contrast · coronal · 5.0mm · 0.43mm/px · 2 of 28 slices shown (2 of 3)]
[im 1/28]
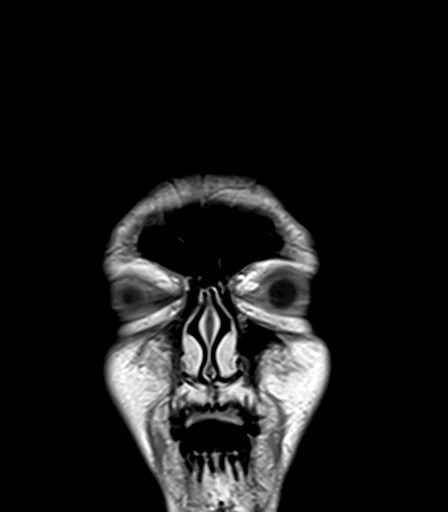
[im 28/28]
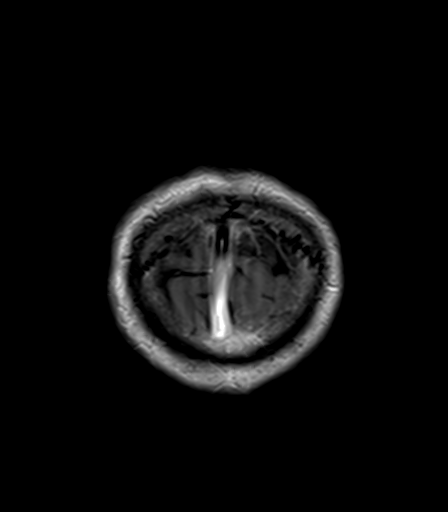

[Series 20: T1 post-contrast · sagittal · 5.0mm · 0.75mm/px · 1 of 24 slices shown (3 of 3)]
[im 1/24]
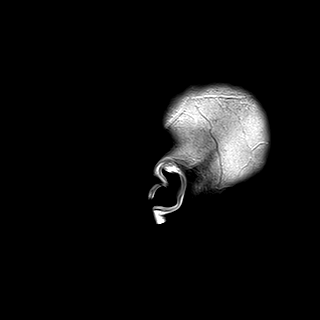

[48 of 48 positions shown; findings below may reference images not displayed]

FINDINGS: Brain: Again seen is a nonenhancing, T2 hyperintense FLAIR
hyperintense lesion centered along the left tectum superiorly
abutting the cerebral aqueduct unchanged in size or appearance.
There is no surrounding FLAIR signal abnormality. There is no
associated intralesional hemorrhage. There is no upstream
hydrocephalus.

There is no acute intracranial hemorrhage, extra-axial fluid
collection, or acute infarct. The ventricles are stable in size.
There are a few prominent perivascular spaces in the basal ganglia
bilaterally, unchanged. Parenchymal signal is otherwise normal.
There is no midline shift.

Vascular: Normal flow voids.

Skull and upper cervical spine: Normal marrow signal.

Sinuses/Orbits: There is a mucous retention cyst in the left
maxillary sinus. The globes and orbits are unremarkable.

Other: None.
IMPRESSION: Unchanged 6 mm nonenhancing lesion centered in the left tectum
abutting the cerebral aqueduct. This lesion is favored to reflect
neoplasm including low-grade glioma or subependymoma. The ventricles
are stable in size, with no upstream hydrocephalus.

## 2022-04-12 MED ORDER — GADOBUTROL 1 MMOL/ML IV SOLN
6.0000 mL | Freq: Once | INTRAVENOUS | Status: AC | PRN
  Administered 2022-04-12: 6 mL via INTRAVENOUS

## 2022-04-13 ENCOUNTER — Ambulatory Visit: Payer: 59 | Admitting: Internal Medicine

## 2022-04-17 ENCOUNTER — Inpatient Hospital Stay: Payer: 59 | Attending: Internal Medicine

## 2022-04-18 ENCOUNTER — Inpatient Hospital Stay (HOSPITAL_BASED_OUTPATIENT_CLINIC_OR_DEPARTMENT_OTHER): Payer: 59 | Admitting: Internal Medicine

## 2022-04-18 DIAGNOSIS — C717 Malignant neoplasm of brain stem: Secondary | ICD-10-CM

## 2022-04-18 NOTE — Progress Notes (Signed)
I connected with Chad Mccoy on 04/18/22 at  9:30 AM EDT by telephone visit and verified that I am speaking with the correct person using two identifiers.  ?I discussed the limitations, risks, security and privacy concerns of performing an evaluation and management service by telemedicine and the availability of in-person appointments. I also discussed with the patient that there may be a patient responsible charge related to this service. The patient expressed understanding and agreed to proceed.  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 ?History of Present Ilness: Chad Mccoy describes no clear improvement in symptoms despite dosing the decadron '4mg'$  daily.  He still describes episodes of dizziness, feeling off balance, blurry vision.  Frequency is unchanged from prior.  Headaches have diminished, however, although he feels he has reflux symptoms since starting the steroid.  Continues to work full time. ?Observations: Language and cognition at baseline ? ?Imaging: ? ?[facility] Clinician Interpretation: I have personally reviewed the CNS images as listed.  My interpretation, in the context of the patient's clinical presentation, is stable disease ? ?MR BRAIN W WO CONTRAST ? ?Result Date: 04/13/2022 ?CLINICAL DATA:  Brain/CNS neoplasm EXAM: MRI HEAD WITHOUT AND WITH CONTRAST TECHNIQUE: Multiplanar, multiecho pulse sequences of the brain and surrounding structures were obtained without and with intravenous contrast. CONTRAST:  40m GADAVIST GADOBUTROL 1 MMOL/ML IV SOLN COMPARISON:  Brain MRI 03/03/2022 FINDINGS: Brain: Again seen is a nonenhancing, T2 hyperintense FLAIR hyperintense lesion centered along the left tectum superiorly abutting the cerebral aqueduct unchanged in size or appearance. There is no surrounding FLAIR signal abnormality. There is no associated  intralesional hemorrhage. There is no upstream hydrocephalus. There is no acute intracranial hemorrhage, extra-axial fluid collection, or acute infarct. The ventricles are stable in size. There are a few prominent perivascular spaces in the basal ganglia bilaterally, unchanged. Parenchymal signal is otherwise normal. There is no midline shift. Vascular: Normal flow voids. Skull and upper cervical spine: Normal marrow signal. Sinuses/Orbits: There is a mucous retention cyst in the left maxillary sinus. The globes and orbits are unremarkable. Other: None. IMPRESSION: Unchanged 6 mm nonenhancing lesion centered in the left tectum abutting the cerebral aqueduct. This lesion is favored to reflect neoplasm including low-grade glioma or subependymoma. The ventricles are stable in size, with no upstream hydrocephalus. Electronically Signed   By: PValetta MoleM.D.   On: 04/13/2022 10:34   ? 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 ?BMiachel Mccoy clinically and radiographically stable today.  MRI demonstrated no change in volume of T2 signal abnormality over 6 weeks.  Decadron was not helpful with symptom burden, which when combined with MRI findings, greatly increases the likelihood of glial based neoplasm.   ? ?We recommended cutting decadron down to '2mg'$  daily x5 days, then stopping if tolerated. ? ?We discussed potential trial of diamox '250mg'$  BID for symptoms, possibly in part due to intracranial pressure.  He will let uKoreaknow if he wants to try this medication, for now will defer. ? ?Follow Up Instructions: ? ?We ask that [name] to clinic in 3 months following next brain MRI, or sooner as needed. ? ?I discussed the assessment and treatment plan with the patient.  The patient was provided an opportunity to ask questions and all were answered.  The patient agreed with the plan and demonstrated understanding  of the instructions.   ? ?The patient was advised to call back or seek an  in-person evaluation if the symptoms worsen or if the condition fails to improve as anticipated.  I provided 5-10 minutes of non-face-to-face time during this enocunter. ? ?Ventura Sellers, MD ? ? ?I provided 25 minutes of non face-to-face telephone visit time during this encounter, and > 50% was spent counseling as documented under my assessment & plan.  ? ?

## 2022-04-19 ENCOUNTER — Telehealth: Payer: Self-pay | Admitting: Hematology and Oncology

## 2022-04-19 NOTE — Telephone Encounter (Signed)
Scheduled per 4/25 los, pt has been called and confirmed  ?

## 2022-04-20 ENCOUNTER — Other Ambulatory Visit: Payer: Self-pay | Admitting: Radiation Therapy

## 2022-07-17 ENCOUNTER — Inpatient Hospital Stay: Payer: 59

## 2022-07-18 ENCOUNTER — Inpatient Hospital Stay: Payer: 59 | Admitting: Internal Medicine

## 2022-09-11 ENCOUNTER — Ambulatory Visit (HOSPITAL_COMMUNITY)
Admission: RE | Admit: 2022-09-11 | Discharge: 2022-09-11 | Disposition: A | Discharge status: Home / Self Care | Payer: No Typology Code available for payment source | Source: Ambulatory Visit | Attending: Internal Medicine | Admitting: Internal Medicine

## 2022-09-11 DIAGNOSIS — C717 Malignant neoplasm of brain stem: Secondary | ICD-10-CM | POA: Insufficient documentation

## 2022-09-11 MED ORDER — GADOBUTROL 1 MMOL/ML IV SOLN
6.0000 mL | Freq: Once | INTRAVENOUS | Status: AC | PRN
  Administered 2022-09-11: 6 mL via INTRAVENOUS

## 2022-11-06 ENCOUNTER — Other Ambulatory Visit: Payer: Self-pay

## 2022-11-06 ENCOUNTER — Emergency Department (HOSPITAL_COMMUNITY): Payer: 59

## 2022-11-06 ENCOUNTER — Emergency Department (HOSPITAL_COMMUNITY)
Admission: EM | Admit: 2022-11-06 | Discharge: 2022-11-07 | Disposition: A | Discharge status: Home / Self Care | Payer: 59 | Attending: Emergency Medicine | Admitting: Emergency Medicine

## 2022-11-06 ENCOUNTER — Encounter (HOSPITAL_COMMUNITY): Payer: Self-pay

## 2022-11-06 DIAGNOSIS — R519 Headache, unspecified: Secondary | ICD-10-CM | POA: Diagnosis present

## 2022-11-06 DIAGNOSIS — C719 Malignant neoplasm of brain, unspecified: Secondary | ICD-10-CM | POA: Insufficient documentation

## 2022-11-06 DIAGNOSIS — D496 Neoplasm of unspecified behavior of brain: Secondary | ICD-10-CM

## 2022-11-06 LAB — CBC WITH DIFFERENTIAL/PLATELET
Abs Immature Granulocytes: 0.02 10*3/uL (ref 0.00–0.07)
Basophils Absolute: 0 10*3/uL (ref 0.0–0.1)
Basophils Relative: 0 %
Eosinophils Absolute: 0.1 10*3/uL (ref 0.0–0.5)
Eosinophils Relative: 1 %
HCT: 39.6 % (ref 39.0–52.0)
Hemoglobin: 13.5 g/dL (ref 13.0–17.0)
Immature Granulocytes: 0 %
Lymphocytes Relative: 19 %
Lymphs Abs: 1.3 10*3/uL (ref 0.7–4.0)
MCH: 30 pg (ref 26.0–34.0)
MCHC: 34.1 g/dL (ref 30.0–36.0)
MCV: 88 fL (ref 80.0–100.0)
Monocytes Absolute: 0.5 10*3/uL (ref 0.1–1.0)
Monocytes Relative: 7 %
Neutro Abs: 4.8 10*3/uL (ref 1.7–7.7)
Neutrophils Relative %: 73 %
Platelets: 297 10*3/uL (ref 150–400)
RBC: 4.5 MIL/uL (ref 4.22–5.81)
RDW: 12.8 % (ref 11.5–15.5)
WBC: 6.6 10*3/uL (ref 4.0–10.5)
nRBC: 0 % (ref 0.0–0.2)

## 2022-11-06 LAB — BASIC METABOLIC PANEL
Anion gap: 10 (ref 5–15)
BUN: 15 mg/dL (ref 6–20)
CO2: 28 mmol/L (ref 22–32)
Calcium: 8.9 mg/dL (ref 8.9–10.3)
Chloride: 103 mmol/L (ref 98–111)
Creatinine, Ser: 0.85 mg/dL (ref 0.61–1.24)
GFR, Estimated: >60 mL/min (ref >60)
Glucose, Bld: 106 mg/dL — ABNORMAL HIGH (ref 70–99)
Potassium: 3.3 mmol/L — ABNORMAL LOW (ref 3.5–5.1)
Sodium: 141 mmol/L (ref 135–145)

## 2022-11-06 NOTE — ED Triage Notes (Signed)
Pt c/o persisting headaches, states he was diagnosed with a brain tumor. Pt c/o bilateral side pain, and weakness in his legs starting today, starting at approx 1100. Pt c/o nausea yesterday and today. Pt c/o weak vision since March 2023.

## 2022-11-06 NOTE — ED Provider Triage Note (Signed)
Emergency Medicine Provider Triage Evaluation Note  Chad Mccoy , a 36 y.o. male  was evaluated in triage.  Pt complains of headache and leg weakness bilaterally.  States he started to have headache on Friday.  This morning he woke up and the headache has gotten more severe and he started to have leg weakness.  Patient is ambulatory.  Endorses shortness of breath and nausea.  Denies any chest pain, vomiting, bowel changes, urinary symptoms, fever.  Review of Systems  Positive: As above Negative: As above  Physical Exam  BP 119/72   Pulse 77   Temp 98.1 F (36.7 C) (Oral)   Resp 18   SpO2 100%  Gen:   Awake, no distress   Resp:  Normal effort  MSK:   Moves extremities without difficulty  Other:  Neuro exam unremarkable.  Medical Decision Making  Medically screening exam initiated at 3:30 PM.  Appropriate orders placed.  Miachel Roux was informed that the remainder of the evaluation will be completed by another provider, this initial triage assessment does not replace that evaluation, and the importance of remaining in the ED until their evaluation is complete.  Work-up initiated.   Rex Kras, [place] 11/07/22 [phone]

## 2022-11-07 NOTE — ED Provider Notes (Signed)
[facility] DEPT Provider Note  CSN: [id number] Arrival date & time: 11/06/22 1345  Chief Complaint(s) Weakness and Bilateral Side Pain  HPI Chad Mccoy is a 36 y.o. 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  Patient reports having had headaches for 3 days that worsened today while at work.  Headache was moderate to severe and throbbing.  Since being here, the headache has subsided.  Around the same time of the headache, patient noted change in his ability to walk stating that he felt like both of his legs were "fatigued."  Patient was still able to ambulate.  He denies any fall or trauma.  No bladder/bowel incontinence.  Patient states that he is not allowed to sit at work and he has been standing up for a while when symptoms began.  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    Since being here headache improved, his lower extremity weakness has improved.   Patient reports that he was being followed by Dr. Mickeal Skinner from neurooncology but has not seen him since April due to financial constraints from being out of work.  The history is provided by the patient.    Past Medical History History reviewed. No pertinent past medical history. 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 Authorizing Provider  acetaminophen (TYLENOL) 500 MG tablet Take 1,000 mg by mouth every 6 (six) hours as needed for mild pain or headache.    [provider]                                                                                                                                    Allergies Patient has no known allergies.  Review of Systems Review of Systems As noted in HPI  Physical  Exam Vital Signs  I have reviewed the triage vital signs BP 118/70 (BP Location: Left Arm)   Pulse 78   Temp 98.2 F (36.8 C) (Oral)   Resp 18   SpO2 100%   Physical Exam Vitals reviewed.  Constitutional:      General: He is not in acute distress.    Appearance: He is well-developed. He is not diaphoretic.  HENT:     Head: Normocephalic and atraumatic.     Right Ear: External ear normal.     Left Ear: External ear normal.     Nose: Nose normal.     Mouth/Throat:     Mouth: Mucous membranes are moist.  Eyes:     General: No scleral icterus.    Conjunctiva/sclera: Conjunctivae normal.  Neck:     Trachea: Phonation normal.  Cardiovascular:     Rate and Rhythm: Normal rate and regular  rhythm.  Pulmonary:     Effort: Pulmonary effort is normal. No respiratory distress.     Breath sounds: No stridor.  Abdominal:     General: There is no distension.  Musculoskeletal:        General: Normal range of motion.     Cervical back: Normal range of motion.  Neurological:     Mental Status: He is alert and oriented to person, place, and time.     Comments: Mental Status:  Alert and oriented to person, place, and time.  Attention and concentration normal.  Speech clear.  Recent memory is intact  Motor System: Muscle Strength: 5/5 and symmetric in the upper and lower extremities. No pronation or drift.  Muscle Tone: Tone and muscle bulk are normal in the upper and lower extremities.  Reflexes: DTRs: 1+ and symmetrical in all four extremities. No Clonus Coordination: heel-to-shin. No tremor.  Sensation: Intact to light touch   Gait: Routine gait normal.   Psychiatric:        Behavior: Behavior normal.     ED Results and Treatments Labs (all labs ordered are listed, but only abnormal results are displayed) Labs Reviewed  BASIC METABOLIC PANEL - Abnormal; Notable for the following components:      Result Value   Potassium 3.3 (*)    Glucose, Bld 106 (*)    All other  components within normal limits  CBC WITH DIFFERENTIAL/PLATELET                                                                                                                         EKG  EKG Interpretation  Date/Time:  Monday November 06 2022 15:46:40 EST Ventricular Rate:  68 PR Interval:  132 QRS Duration: 95 QT Interval:  376 QTC Calculation: 400 R Axis:   56 Text Interpretation: Sinus rhythm ST elev, probable normal early repol pattern Confirmed by Addison Lank [phone]) on 11/07/2022 1:20:54 AM       Radiology CT Head Wo Contrast  Result Date: 11/06/2022 CLINICAL DATA:  Persistent headaches, nausea, brain tumor EXAM: CT HEAD WITHOUT CONTRAST TECHNIQUE: Contiguous axial images were obtained from the base of the skull through the vertex without intravenous contrast. RADIATION DOSE REDUCTION: This exam was performed according to the departmental dose-optimization program which includes automated exposure control, adjustment of the mA and/or kV according to patient size and/or use of iterative reconstruction technique. COMPARISON:  MRI brain dated 09/11/2022 FINDINGS: Brain: No evidence of acute infarction, hemorrhage, hydrocephalus, extra-axial collection or mass effect. Known 6 mm left paramidline lesion along the upper apple duct/tectum (series 2/image 16), better evaluated on multiple prior MRIs, unchanged. Vascular: No hyperdense vessel or unexpected calcification. Skull: Normal. Negative for fracture or focal lesion. Sinuses/Orbits: The visualized paranasal sinuses are essentially clear. The mastoid air cells are unopacified. Other: None. IMPRESSION: No evidence of acute intracranial abnormality. Known 6 mm left paramidline lesion along the upper apple duct/tectum, better evaluated on multiple prior MRIs, unchanged. Electronically Signed  By: Julian Hy M.D.   On: 11/06/2022 19:27   DG Chest 2 View  Result Date: 11/06/2022 CLINICAL DATA:  Per chart - Pt c/o persisting  headaches, states he was diagnosed with a brain tumor. Pt c/o bilateral side pain, and weakness in his legs starting today, starting at approx 1100. Pt c/o nausea yesterday and today. Pt c/o weak vision since March 2023. shortness of breath EXAM: CHEST - 2 VIEW COMPARISON:  None Available. FINDINGS: Normal mediastinum and cardiac silhouette. Normal pulmonary vasculature. No evidence of effusion, infiltrate, or pneumothorax. No acute bony abnormality. IMPRESSION: Normal chest radiograph Electronically Signed   By: Suzy Bouchard M.D.   On: 11/06/2022 16:02    Medications Ordered in ED Medications - No data to display                                                                                                                                   Procedures Procedures  (including critical care time)  Medical Decision Making / ED Course   Medical Decision Making Amount and/or Complexity of Data Reviewed Labs: ordered. Decision-making details documented in ED Course. Radiology: ordered and independent interpretation performed. Decision-making details documented in ED Course. ECG/medicine tests: ordered and independent interpretation performed. Decision-making details documented in ED Course.  Risk Decision regarding hospitalization.     Complexity of Problem:  Co-morbidities/SDOH that complicate the patient evaluation/care: Midbrain glioma  Additional history obtained: Chart confirming glioma, stable on MRI from Sept.  Patient's presenting problem/concern, DDX, and MDM listed below: Headache with BLE "fatigue" Will need to assess for any changes of the patient's known glioma.  We will also assess for any bleed. Exam and history are not concerning for cauda equina Patient was seen in the MSE process and had labs and imaging obtained.     Complexity of Data:   Cardiac Monitoring/EKG: EKG without acute ischemic changes, dysrhythmias or blocks.  Laboratory Tests ordered listed  below with my independent interpretation: CBC without leukocytosis or anemia. BMP with mild hypokalemia.  No other electrolyte derangements or renal sufficiency.   Imaging Studies ordered listed below with my independent interpretation: Chest x-ray without pneumonia, pneumothorax, pulmonary edema or pleural effusions. CT head showed unchanged mid brain tumor with no bleed or surrounding edema.     ED Course:    Assessment, Add'l Intervention, and Reassessment: Patient's symptoms have significantly improved since being here without intervention. Work-up is not concerning for serious etiology  Recommend close follow-up with Dr. Mickeal Skinner       Final Clinical Impression(s) / ED Diagnoses Final diagnoses:  Brain tumor [facility])   The patient appears reasonably screened and/or stabilized for discharge and I doubt any other medical condition or other [facility] requiring further screening, evaluation, or treatment in the ED at this time. I have discussed the findings, Dx and Tx plan with the patient/family who expressed understanding and agree(s) with the plan. Discharge instructions discussed  at length. The patient/family was given strict return precautions who verbalized understanding of the instructions. No further questions at time of discharge.  Disposition: Discharge  Condition: Good  ED Discharge Orders     None         Follow Up: Ventura Sellers, [place] [address] [phone]  Call  to schedule an appointment for close follow up           This chart was dictated using voice recognition software.  Despite best efforts to proofread,  errors can occur which can change the documentation meaning.    Fatima Blank, MD 11/07/22 [phone]

## 2023-07-26 ENCOUNTER — Other Ambulatory Visit: Payer: Self-pay

## 2023-07-26 ENCOUNTER — Encounter (HOSPITAL_COMMUNITY): Payer: Self-pay

## 2023-07-26 ENCOUNTER — Emergency Department (HOSPITAL_COMMUNITY)
Admission: EM | Admit: 2023-07-26 | Discharge: 2023-07-26 | Discharge status: Against Medical Advice | Payer: 59 | Attending: Emergency Medicine | Admitting: Emergency Medicine

## 2023-07-26 DIAGNOSIS — R109 Unspecified abdominal pain: Secondary | ICD-10-CM | POA: Insufficient documentation

## 2023-07-26 DIAGNOSIS — U071 COVID-19: Secondary | ICD-10-CM | POA: Insufficient documentation

## 2023-07-26 DIAGNOSIS — Z5321 Procedure and treatment not carried out due to patient leaving prior to being seen by health care provider: Secondary | ICD-10-CM | POA: Insufficient documentation

## 2023-07-26 LAB — CBC
HCT: 43.3 % (ref 39.0–52.0)
Hemoglobin: 15.2 g/dL (ref 13.0–17.0)
MCH: 30 pg (ref 26.0–34.0)
MCHC: 35.1 g/dL (ref 30.0–36.0)
MCV: 85.6 fL (ref 80.0–100.0)
Platelets: UNDETERMINED 10*3/uL (ref 150–400)
RBC: 5.06 MIL/uL (ref 4.22–5.81)
RDW: 12.4 % (ref 11.5–15.5)
WBC: 5.1 10*3/uL (ref 4.0–10.5)
nRBC: 0 % (ref 0.0–0.2)

## 2023-07-26 LAB — RESP PANEL BY RT-PCR (RSV, FLU A&B, COVID)  RVPGX2
Influenza A by PCR: NEGATIVE
Influenza B by PCR: NEGATIVE
Resp Syncytial Virus by PCR: NEGATIVE
SARS Coronavirus 2 by RT PCR: POSITIVE — AB

## 2023-07-26 NOTE — ED Notes (Signed)
Lab said the light green hemolized, need to recollect.

## 2023-07-26 NOTE — ED Triage Notes (Addendum)
Since Sunday pt has been feeling light headed, dizziness, flank pain, and loss of taste. Does not endorse any SOB. Pt states he has a hx of brain tumor.
